# Patient Record
Sex: Female | Born: 1985 | Race: Black or African American | Hispanic: No | Marital: Single | Smoking: Never smoker
Health system: Southern US, Community
[De-identification: ages and names within clinical notes are randomized; demographics above are authoritative.]

## PROBLEM LIST (undated history)

## (undated) DIAGNOSIS — D649 Anemia, unspecified: Secondary | ICD-10-CM

## (undated) DIAGNOSIS — N760 Acute vaginitis: Secondary | ICD-10-CM

## (undated) DIAGNOSIS — B9689 Other specified bacterial agents as the cause of diseases classified elsewhere: Secondary | ICD-10-CM

## (undated) DIAGNOSIS — J45909 Unspecified asthma, uncomplicated: Secondary | ICD-10-CM

## (undated) HISTORY — DX: Acute vaginitis: N76.0

## (undated) HISTORY — DX: Unspecified asthma, uncomplicated: J45.909

## (undated) HISTORY — DX: Other specified bacterial agents as the cause of diseases classified elsewhere: B96.89

## (undated) HISTORY — DX: Anemia, unspecified: D64.9

---
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
## 2007-02-17 ENCOUNTER — Inpatient Hospital Stay (HOSPITAL_COMMUNITY): Admission: AD | Admit: 2007-02-17 | Discharge: 2007-02-17 | Payer: Self-pay | Admitting: Obstetrics and Gynecology

## 2007-02-27 ENCOUNTER — Inpatient Hospital Stay (HOSPITAL_COMMUNITY): Admission: AD | Admit: 2007-02-27 | Discharge: 2007-03-01 | Payer: Self-pay | Admitting: Obstetrics and Gynecology

## 2007-04-08 ENCOUNTER — Other Ambulatory Visit: Admission: RE | Admit: 2007-04-08 | Discharge: 2007-04-08 | Payer: Self-pay | Admitting: Obstetrics and Gynecology

## 2007-08-01 ENCOUNTER — Emergency Department (HOSPITAL_COMMUNITY): Admission: EM | Admit: 2007-08-01 | Discharge: 2007-08-01 | Payer: Self-pay | Admitting: Emergency Medicine

## 2008-10-15 IMAGING — US US OB COMP +14 WK
1 series · 14 of 28 positions shown · non-contrast
Comparison: none

OBSTETRICAL ULTRASOUND:

 This ultrasound exam was performed in the [HOSPITAL] Ultrasound Department.  The OB US report was generated in the AS system, and faxed to the ordering physician.  This report is also available in [REDACTED] PACS.

[Series 1: us ob comp +14 wk · 0.30mm/px · 14 of 76 slices shown]
[im 3/76]
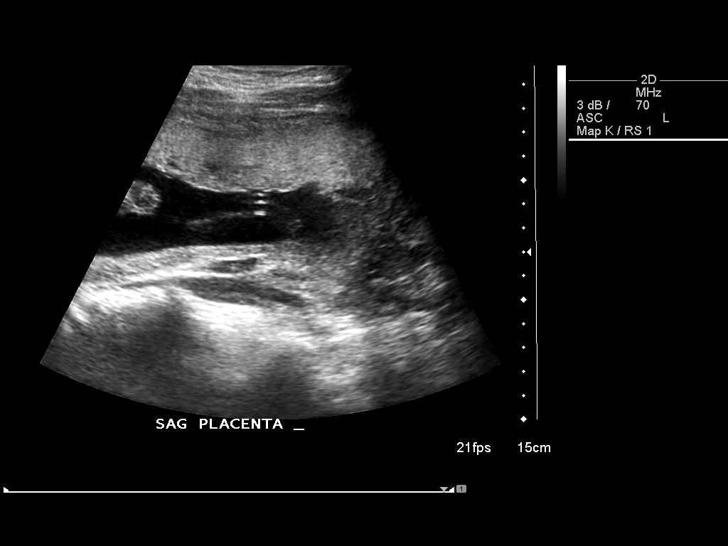
[im 9/76]
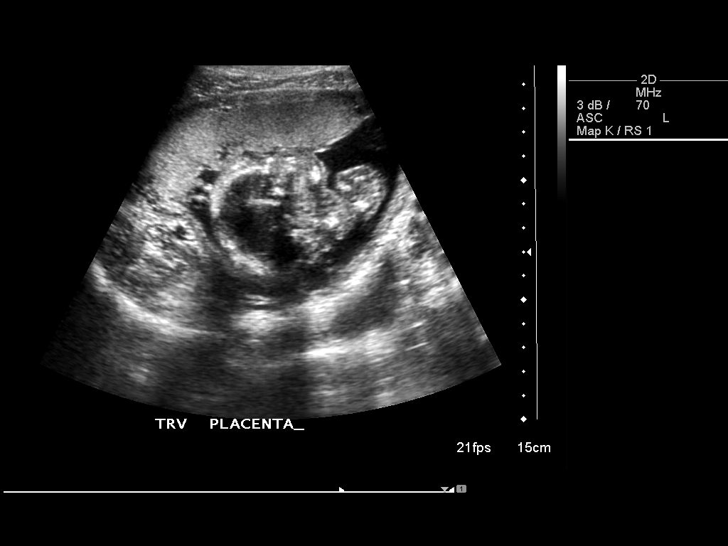
[im 14/76]
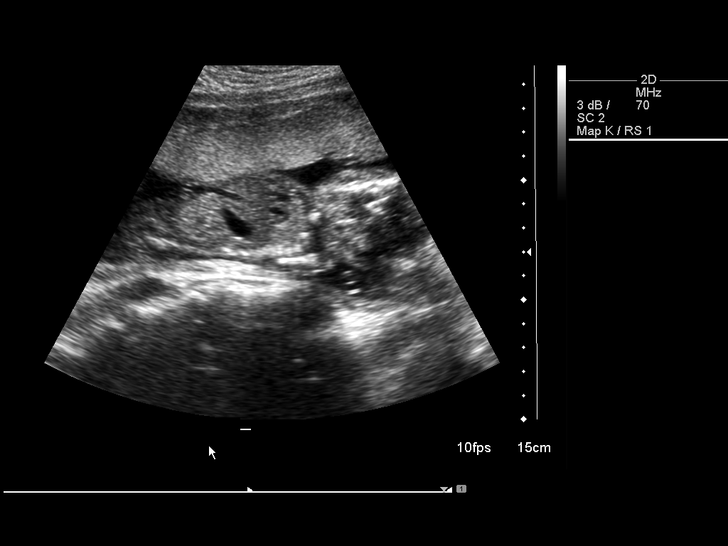
[im 20/76]
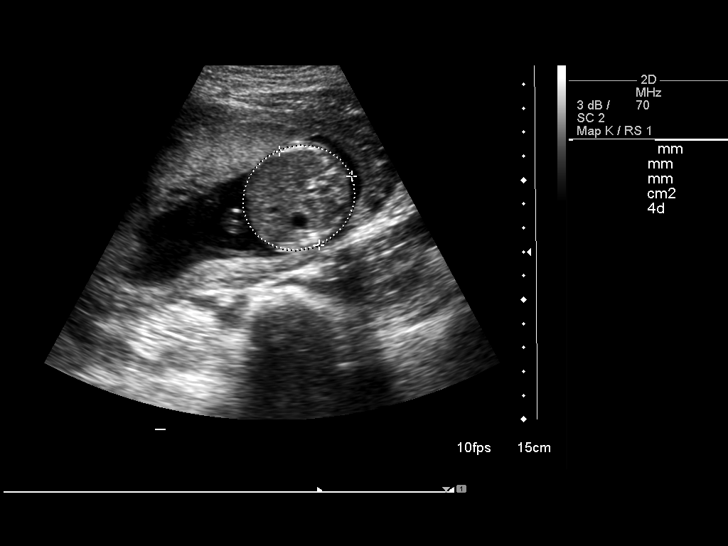
[im 26/76]
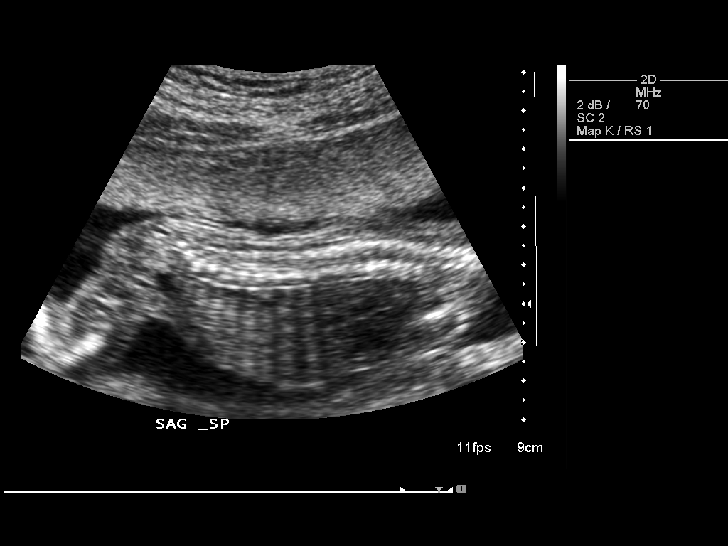
[im 31/76]
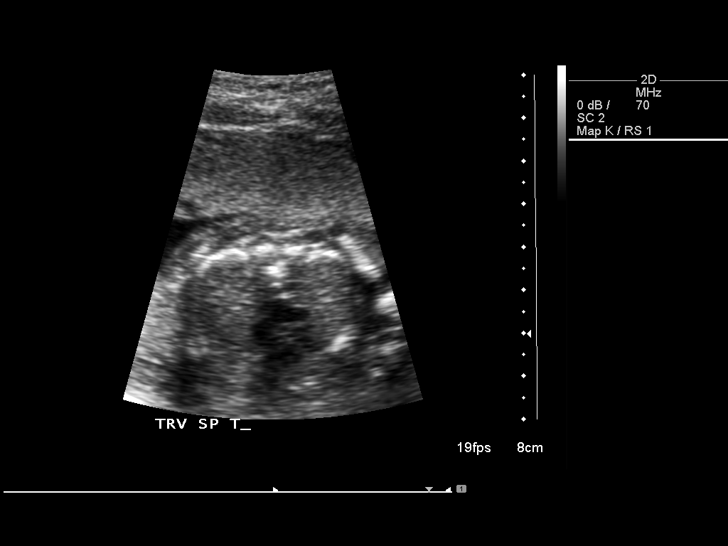
[im 37/76]
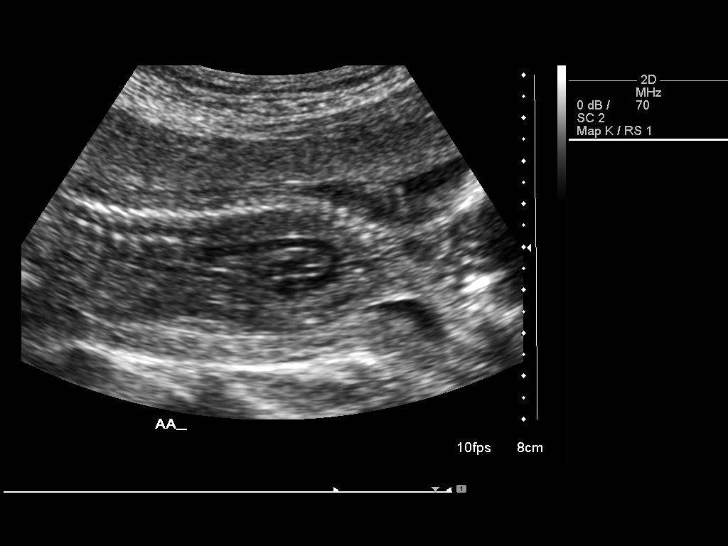
[im 42/76]
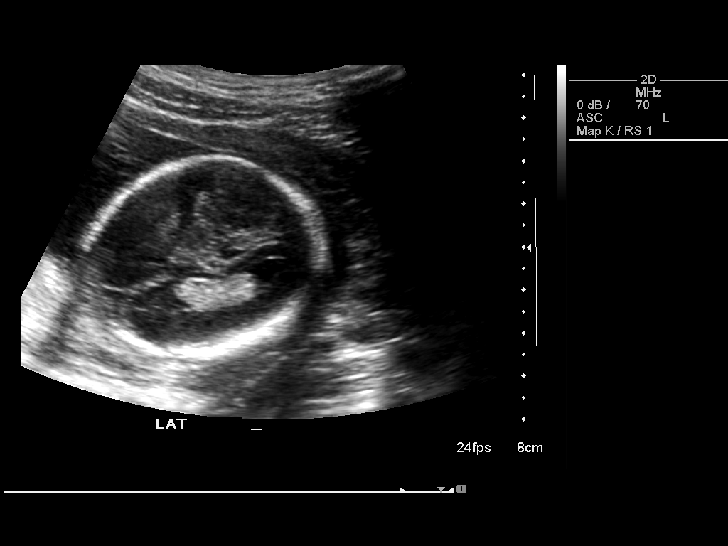
[im 48/76]
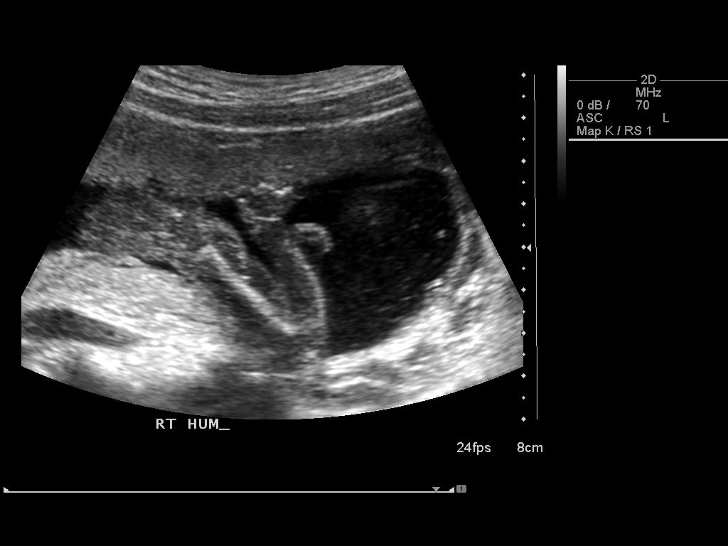
[im 53/76]
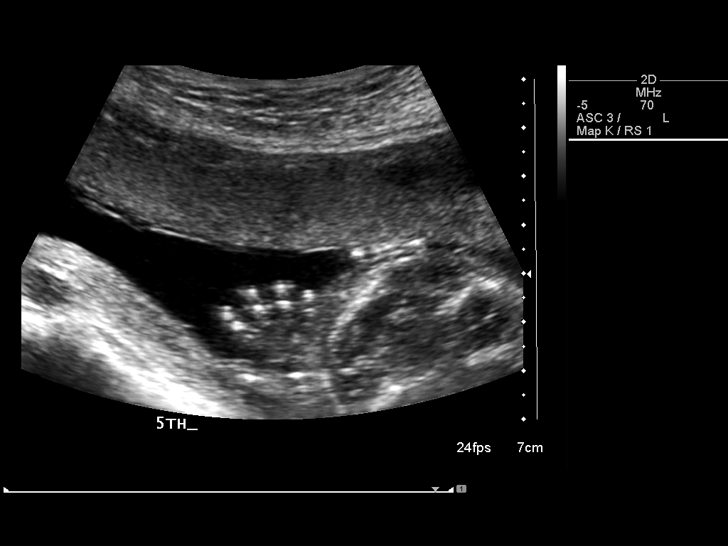
[im 59/76]
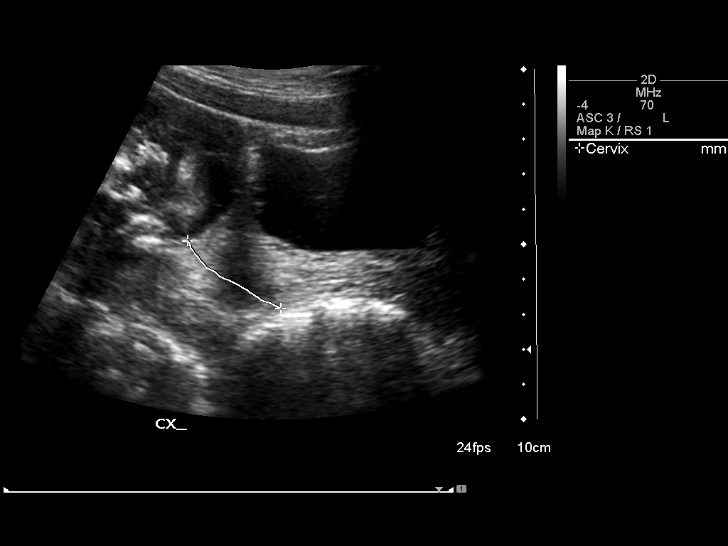
[im 64/76]
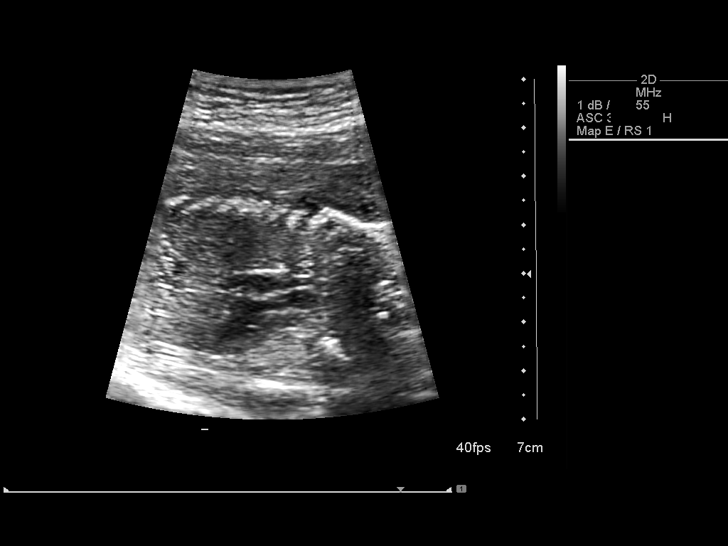
[im 70/76]
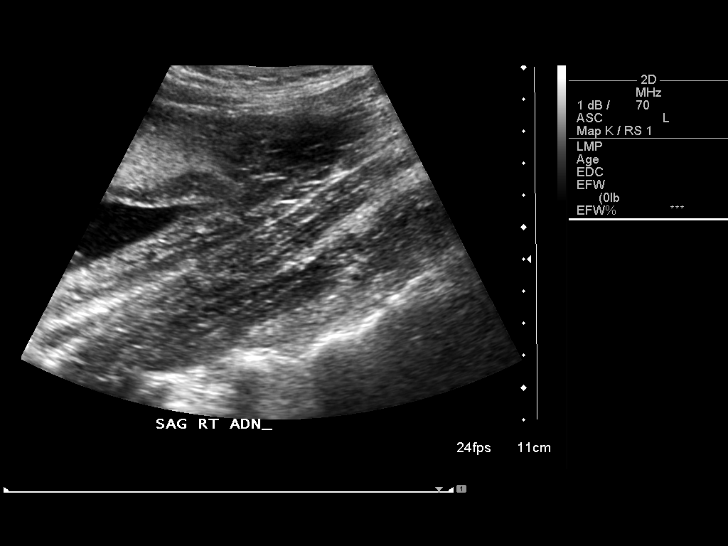
[im 76/76]
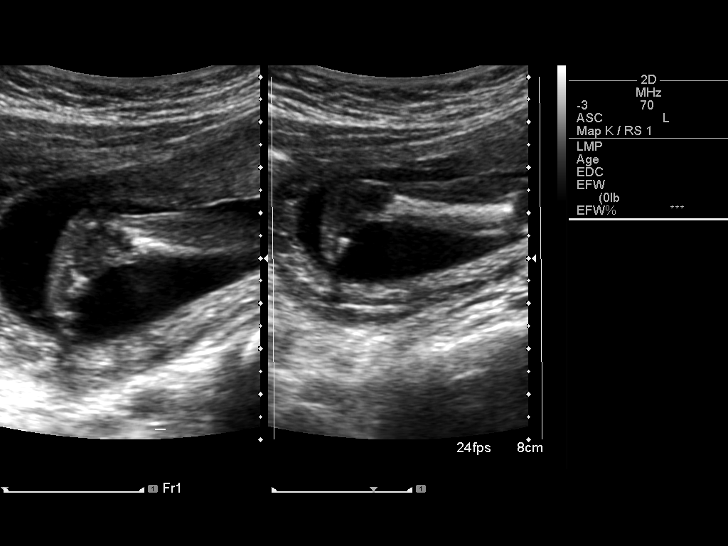

[14 of 28 positions shown; findings below may reference images not displayed]

IMPRESSION: See AS Obstetric US report.

## 2010-06-05 ENCOUNTER — Encounter: Payer: Self-pay | Admitting: Obstetrics and Gynecology

## 2010-09-30 NOTE — Discharge Summary (Signed)
Lindsay Larsen, Lindsay Larsen                ACCOUNT NO.:  [id number]   MEDICAL RECORD NO.:  [id number]          PATIENT TYPE:  INP   LOCATION:  9127                          FACILITY:  [facility]   PHYSICIAN:  Rudy Jew. [name], M.D.DATE OF BIRTH:  1986/03/07   DATE OF ADMISSION:  02/27/2007  DATE OF DISCHARGE:  03/01/2007                               DISCHARGE SUMMARY   DISCHARGE DIAGNOSES:  1. Intrauterine pregnancy at term, delivered.  2. Term birth of living child, vertex.   OPERATIONS AND PROCEDURES:  OB delivery with episiotomy, episiorrhaphy.   CONSULTATIONS:  None.   DISCHARGE MEDICATIONS:  None.   HISTORY AND PHYSICAL:  An 25 year old primigravida, 38 weeks and 4 days  gestation.  Prenatal care was complicated by asthma which required no  medications.  She presented complaining of contractions.  She denied  rupture of membranes or bleeding.  Cervical examination by me revealed 6-  7 cm dilatation, 100% effacement, zero station, vertex presentation.  For the remainder of the history and physical, please see the chart.   HOSPITAL COURSE:  The patient was admitted to [facility] of  [place].  Admission laboratory studies were drawn.  She went on to  labor and delivered on February 27, 2007.  The infant was a 6 pound, 10  ounce female, Apgars 9 at one minute, 9 at five minutes, sent to the  newborn nursery.  The labor was accomplished over a second-degree  midline episiotomy which was repaired without difficulty.  The patient's  postpartum course was benign.  She was discharged on the second  postpartum day, afebrile and in satisfactory condition.   DISPOSITION:  The patient is to return to [facility] at approximately 6 weeks for a postpartum evaluation.      James A. [name], M.D.  Electronically Signed     JAM/MEDQ  D:  03/21/2007  T:  03/21/2007  Job:  [id number]

## 2011-02-06 LAB — RAPID STREP SCREEN (MED CTR MEBANE ONLY): Streptococcus, Group A Screen (Direct): NEGATIVE

## 2011-02-22 LAB — CBC
HCT: 34 — ABNORMAL LOW
MCHC: 34.5
Platelets: 155
Platelets: 166
RBC: 3.24 — ABNORMAL LOW
RDW: 12.5

## 2011-03-02 LAB — KLEIHAUER-BETKE STAIN
Fetal Cells %: 0
Quantitation Fetal Hemoglobin: 0

## 2012-01-26 ENCOUNTER — Ambulatory Visit (INDEPENDENT_AMBULATORY_CARE_PROVIDER_SITE_OTHER): Payer: 59 | Admitting: Obstetrics and Gynecology

## 2012-01-26 ENCOUNTER — Encounter: Payer: Self-pay | Admitting: Obstetrics and Gynecology

## 2012-01-26 VITALS — BP 100/68 | HR 72 | Wt 166.0 lb

## 2012-01-26 DIAGNOSIS — N760 Acute vaginitis: Secondary | ICD-10-CM

## 2012-01-26 DIAGNOSIS — B9689 Other specified bacterial agents as the cause of diseases classified elsewhere: Secondary | ICD-10-CM

## 2012-01-26 DIAGNOSIS — A499 Bacterial infection, unspecified: Secondary | ICD-10-CM

## 2012-01-26 DIAGNOSIS — Z30432 Encounter for removal of intrauterine contraceptive device: Secondary | ICD-10-CM

## 2012-01-26 LAB — POCT WET PREP (WET MOUNT)

## 2012-01-26 MED ORDER — METRONIDAZOLE 500 MG PO TABS: 500.0000 mg | ORAL_TABLET | Freq: Two times a day (BID) | ORAL | Status: DC

## 2012-01-26 NOTE — Patient Instructions (Signed)
Avoid: - excess soap on genital area (consider using plain oatmeal soap) - use of powder or sprays in genital area - douching - wearing underwear to bed (except with menses) - using more than is directed detergent when washing clothes - tight fitting garments around genital area - excess sugar intake   

## 2012-01-26 NOTE — Progress Notes (Signed)
26 YO for Mirena IUD removal (placed by another practice in 2008)  Doesn't want another contraceptive for now, will get another IUD later.  Patient also "thinks" she has another BV infection and wants to be checked.  O: Pelvic:  EGBUS-wnl, vagina-pink discharge, cervix-IUD removed without difficulty, uterus/adnexae-normal  Wet Prep: ph-5.0,  whiff-positive,  moderate clue cells  A:  IUD Removal      Bacterial Vaginosis  P:  Metronidazole 500 mg #14 bid x 7 days,  Etoh precautions       Handouts given:  IUD Instruction Sheet and Methods of Contraception Choices       RTO-as scheduled or prn  Charlette Hennings, PA-C

## 2012-01-29 ENCOUNTER — Other Ambulatory Visit: Payer: Self-pay

## 2012-01-29 ENCOUNTER — Telehealth: Payer: Self-pay | Admitting: Obstetrics and Gynecology

## 2012-01-29 ENCOUNTER — Other Ambulatory Visit: Payer: Self-pay | Admitting: Obstetrics and Gynecology

## 2012-01-29 MED ORDER — METRONIDAZOLE 500 MG PO TABS: 500.0000 mg | ORAL_TABLET | Freq: Two times a day (BID) | ORAL | Status: AC

## 2012-01-29 NOTE — Telephone Encounter (Signed)
Lm on vm rx sen to pharm if pharm states they still haven't received rx call back will call rx in verbally

## 2012-01-29 NOTE — Telephone Encounter (Signed)
Spoke with pt rgd msg pt states rx not at pharmacy advised pt rx sent to pharm 01/26/12 call pharm if still not there call office we can re send rx pt voice understanding

## 2012-01-30 ENCOUNTER — Telehealth: Payer: Self-pay

## 2012-01-30 NOTE — Telephone Encounter (Signed)
Spoke with pt rgd concerns pt states rx sent to [facility] and rx too expensive wants rx called to [facility] advised pt [facility] called to [facility] [phone] pt voice understanding

## 2012-06-28 ENCOUNTER — Encounter: Payer: Self-pay | Admitting: Obstetrics and Gynecology

## 2012-06-28 ENCOUNTER — Ambulatory Visit: Payer: 59 | Admitting: Obstetrics and Gynecology

## 2012-06-28 VITALS — BP 100/70 | Temp 98.7°F | Ht 66.0 in | Wt 170.0 lb

## 2012-06-28 DIAGNOSIS — K625 Hemorrhage of anus and rectum: Secondary | ICD-10-CM

## 2012-06-28 DIAGNOSIS — Z01419 Encounter for gynecological examination (general) (routine) without abnormal findings: Secondary | ICD-10-CM

## 2012-06-28 DIAGNOSIS — Z124 Encounter for screening for malignant neoplasm of cervix: Secondary | ICD-10-CM

## 2012-06-28 DIAGNOSIS — Z113 Encounter for screening for infections with a predominantly sexual mode of transmission: Secondary | ICD-10-CM

## 2012-06-28 LAB — CBC
HCT: 37 % (ref 36.0–46.0)
Hemoglobin: 12.6 g/dL (ref 12.0–15.0)
MCV: 92.5 fL (ref 78.0–100.0)
Platelets: 206 10*3/uL (ref 150–400)
RBC: 4 MIL/uL (ref 3.87–5.11)
WBC: 6.5 10*3/uL (ref 4.0–10.5)

## 2012-06-28 NOTE — Progress Notes (Signed)
Regular Periods: yes Mammogram: no  Monthly Breast Ex.: yes Exercise: yes  Tetanus < 10 years: no Seatbelts: yes  NI. Bladder Functn.: yes Abuse at home: no  Daily BM's: yes Stressful Work: yes  Healthy Diet: yes Sigmoid-Colonoscopy: no  Calcium: no Medical problems this year: none   LAST PAP:2/13  Contraception: abst.  Mammogram:  no  PCP: no  PMH: no change  FMH: no change  Last Bone Scan: no  Pt is single.

## 2012-06-28 NOTE — Progress Notes (Addendum)
Subjective:    Lindsay Larsen is a 27 y.o. female, G1P1, who presents for an annual exam. The patient reports seeing lots of blood in the stool 1-2 times a month with bowel movements, though they are not painful.  Has on occasion had rectal bleeding (bright red blood)  up to 1.5 weeks. Has a BM every other day with no discomfort or gas. Denies family history of colon issues.  Has on occasion had abdominal pains that are crampy in upper abdomen and nauseated.  Does not have a PCP.Admits to some fatigue and occasional breast/chest pains (shooting) but no shortness of breath, craving of ice or dizziness.   Has right shoulder pain with tingling in right hand ulnar aspect, especially when arms are above head. These symptoms have been present for the past year.  Menstrual cycle:   LMP: Patient's last menstrual period was 05/31/2012.             Review of Systems Pertinent items are noted in HPI. Denies pelvic pain, urinary tract symptoms, vaginitis symptoms, irregular bleeding, menopausal symptoms, change in bowel habits or rectal bleeding   Objective:    BP 100/70  Temp(Src) 98.7 F (37.1 C) (Oral)  Ht 5\' 6"  (1.676 m)  Wt 170 lb (77.111 kg)  BMI 27.45 kg/m2  LMP 05/31/2012     Wt Readings from Last 1 Encounters:  06/28/12 170 lb (77.111 kg)   Body mass index is 27.45 kg/(m^2). General Appearance: Alert, no acute distress HEENT: Grossly normal Neck / Thyroid: Supple, no thyromegaly or cervical adenopathy Lungs: Clear to auscultation bilaterally Back: No CVA tenderness Breast Exam: No masses or nodes.No dimpling, nipple retraction or discharge. Cardiovascular: Regular rate and rhythm.  Gastrointestinal: Soft, non-tender, no masses or organomegaly Pelvic Exam: EGBUS-wnl, vagina-normal rugae, cervix- without lesions or tenderness, uterus appears normal size shape and consistency, adnexae-no masses or tenderness Rectal: small fissure but no visible hemorrhoids or bleeding sites Lymphatic Exam:  Non-palpable nodes in neck, clavicular,  axillary, or inguinal regions  Skin: no rashes or abnormalities Extremities: no clubbing cyanosis or edema  Neurologic: grossly normal Psychiatric: Alert and oriented   Assessment:   Routine GYN Exam Rectal Bleeding Right Shoulder Pain and Right Hand Paresthesia   Plan:    PAP sent  STD testing & CBC-pending  Refer to [facility] for evaluation of right shoulder pain  Refer to Dr. Loreta Ave for evaluation of rectal bleeding  RTO 1 year or prn  After exam, patient wanted to be  checked for BV however, gel had been used in vagina.  Will await PAP results  Lindsay Larsen,ELMIRAPA-C

## 2012-07-01 ENCOUNTER — Telehealth: Payer: Self-pay

## 2012-07-01 LAB — PAP IG, CT-NG, RFX HPV ASCU
Chlamydia Probe Amp: NEGATIVE
GC Probe Amp: NEGATIVE

## 2012-07-01 MED ORDER — METRONIDAZOLE 0.75 % VA GEL: VAGINAL | Status: DC

## 2012-07-01 NOTE — Telephone Encounter (Signed)
Tc to pt regarding pap. Informed pt that bv showed on her pap and I need to call in rx for pt. Pt chosed the gel instead of the pill. Will call in rx for pt. Pt voiced understanding.

## 2012-07-01 NOTE — Telephone Encounter (Signed)
Message copied by Winfred Leeds on Mon Jul 01, 2012  2:25 PM ------      Message from: Henreitta Leber      Created: Mon Jul 01, 2012 11:09 AM       Please contact patient to offer Metrogel #1 tube 1 appl pv daily x 5 days or Metronidazole 500 mg bid x 7 days for bacterial vaginosis that showed on PAP smear.  Thank you.  EP                  ----- Message -----         From: [facility]         Sent: 06/28/2012   9:59 PM           To: Henreitta Leber, PA                   ------

## 2012-08-09 ENCOUNTER — Other Ambulatory Visit: Payer: Self-pay | Admitting: Obstetrics and Gynecology

## 2014-01-29 ENCOUNTER — Ambulatory Visit: Payer: Self-pay | Admitting: Physical Therapy

## 2014-03-16 ENCOUNTER — Encounter: Payer: Self-pay | Admitting: Obstetrics and Gynecology

## 2016-01-17 ENCOUNTER — Encounter (HOSPITAL_COMMUNITY): Payer: Self-pay | Admitting: Emergency Medicine

## 2016-01-17 ENCOUNTER — Emergency Department (HOSPITAL_COMMUNITY)
Admission: EM | Admit: 2016-01-17 | Discharge: 2016-01-17 | Disposition: A | Discharge status: Home / Self Care | Payer: BLUE CROSS/BLUE SHIELD | Attending: Emergency Medicine | Admitting: Emergency Medicine

## 2016-01-17 DIAGNOSIS — N760 Acute vaginitis: Secondary | ICD-10-CM | POA: Diagnosis not present

## 2016-01-17 DIAGNOSIS — Z202 Contact with and (suspected) exposure to infections with a predominantly sexual mode of transmission: Secondary | ICD-10-CM | POA: Diagnosis not present

## 2016-01-17 DIAGNOSIS — N898 Other specified noninflammatory disorders of vagina: Secondary | ICD-10-CM | POA: Diagnosis present

## 2016-01-17 DIAGNOSIS — J45909 Unspecified asthma, uncomplicated: Secondary | ICD-10-CM | POA: Insufficient documentation

## 2016-01-17 DIAGNOSIS — B9689 Other specified bacterial agents as the cause of diseases classified elsewhere: Secondary | ICD-10-CM

## 2016-01-17 LAB — URINALYSIS, ROUTINE W REFLEX MICROSCOPIC
Bilirubin Urine: NEGATIVE
GLUCOSE, UA: NEGATIVE mg/dL
Hgb urine dipstick: NEGATIVE
Ketones, ur: NEGATIVE mg/dL
Nitrite: POSITIVE — AB
PH: 5.5 (ref 5.0–8.0)
Protein, ur: NEGATIVE mg/dL
Specific Gravity, Urine: 1.016 (ref 1.005–1.030)

## 2016-01-17 LAB — WET PREP, GENITAL
Sperm: NONE SEEN
Trich, Wet Prep: NONE SEEN
YEAST WET PREP: NONE SEEN

## 2016-01-17 LAB — I-STAT BETA HCG BLOOD, ED (MC, WL, AP ONLY): I-stat hCG, quantitative: <5 m[IU]/mL (ref <5)

## 2016-01-17 LAB — URINE MICROSCOPIC-ADD ON: RBC / HPF: NONE SEEN RBC/hpf (ref 0–5)

## 2016-01-17 LAB — RAPID HIV SCREEN (HIV 1/2 AB+AG)
HIV 1/2 ANTIBODIES: NONREACTIVE
HIV-1 P24 ANTIGEN - HIV24: NONREACTIVE

## 2016-01-17 MED ORDER — AZITHROMYCIN 250 MG PO TABS
1000.0000 mg | ORAL_TABLET | Freq: Once | ORAL | Status: AC
  Administered 2016-01-17: 1000 mg via ORAL
  Filled 2016-01-17: qty 4

## 2016-01-17 MED ORDER — METRONIDAZOLE 500 MG PO TABS: 500.0000 mg | ORAL_TABLET | Freq: Two times a day (BID) | ORAL | 0 refills | Status: AC

## 2016-01-17 MED ORDER — LIDOCAINE HCL 1 % IJ SOLN
INTRAMUSCULAR | Status: AC
  Administered 2016-01-17: 0.9 mL
  Filled 2016-01-17: qty 20

## 2016-01-17 MED ORDER — CEFTRIAXONE SODIUM 250 MG IJ SOLR
250.0000 mg | Freq: Once | INTRAMUSCULAR | Status: AC
  Administered 2016-01-17: 250 mg via INTRAMUSCULAR
  Filled 2016-01-17: qty 250

## 2016-01-17 NOTE — ED Provider Notes (Signed)
[facility]-EMERGENCY DEPT Provider Note   CSN: [id number] Arrival date & time: 01/17/16  0934     History   Chief Complaint Chief Complaint  Patient presents with  . Vaginal Discharge    HPI Lindsay Larsen is a 30 y.o. female.  30 yo F with a chief complaint of STD exposure and vaginal discharge. She was just notified of this yesterday. Denies pelvic pain denies vaginal bleeding denies abdominal pain or fever. Has had some whitish foul-smelling discharge. States that her partner was diagnosed with chlamydia.   The history is provided by the patient.  Vaginal Discharge   This is a new problem. The current episode started yesterday. The problem occurs constantly. The problem has not changed since onset.The discharge occurs while at rest. The discharge was white. She is not pregnant. She has not missed her period. Pertinent negatives include no fever, no nausea, no vomiting and no dysuria. She has tried nothing for the symptoms. The treatment provided no relief.    Past Medical History:  Diagnosis Date  . Anemia    h/o  . Asthma   . Bacterial vaginosis     There are no active problems to display for this patient.   History reviewed. No pertinent surgical history.  OB History    Gravida Para Term Preterm AB Living   1 1       1    SAB TAB Ectopic Multiple Live Births                   Home Medications    Prior to Admission medications   Medication Sig Start Date End Date Taking? Authorizing Provider  metroNIDAZOLE (FLAGYL) 500 MG tablet Take 1 tablet (500 mg total) by mouth 2 (two) times daily. One po bid x 7 days 01/17/16   Melene Plan, DO    Family History Family History  Problem Relation Age of Onset  . Thyroid disease Maternal Grandmother   . Diabetes Other     Social History Social History  Substance Use Topics  . Smoking status: Never Smoker  . Smokeless tobacco: Never Used  . Alcohol use Yes     Comment: occasionally     Allergies   Other; Penicillins;  and Latex   Review of Systems Review of Systems  Constitutional: Negative for chills and fever.  HENT: Negative for congestion and rhinorrhea.   Eyes: Negative for redness and visual disturbance.  Respiratory: Negative for shortness of breath and wheezing.   Cardiovascular: Negative for chest pain and palpitations.  Gastrointestinal: Negative for nausea and vomiting.  Genitourinary: Positive for vaginal discharge. Negative for dysuria and urgency.  Musculoskeletal: Negative for arthralgias and myalgias.  Skin: Negative for pallor and wound.  Neurological: Negative for dizziness and headaches.     Physical Exam Updated Vital Signs BP 124/64 (BP Location: Right Arm)   Pulse 62   Temp 98.2 F (36.8 C) (Oral)   Resp 18   Ht 5\' 6"  (1.676 m)   Wt 196 lb (88.9 kg)   LMP 01/10/2016   SpO2 99%   BMI 31.64 kg/m   Physical Exam  Constitutional: She is oriented to person, place, and time. She appears well-developed and well-nourished. No distress.  HENT:  Head: Normocephalic and atraumatic.  Eyes: EOM are normal. Pupils are equal, round, and reactive to light.  Neck: Normal range of motion. Neck supple.  Cardiovascular: Normal rate and regular rhythm.  Exam reveals no gallop and no friction rub.   No murmur  heard. Pulmonary/Chest: Effort normal. She has no wheezes. She has no rales.  Abdominal: Soft. She exhibits no distension and no mass. There is no tenderness. There is no guarding.  Genitourinary: Cervix exhibits discharge (whitish) and friability. Cervix exhibits no motion tenderness. Right adnexum displays no mass, no tenderness and no fullness. Left adnexum displays no mass, no tenderness and no fullness.  Musculoskeletal: She exhibits no edema or tenderness.  Neurological: She is alert and oriented to person, place, and time.  Skin: Skin is warm and dry. She is not diaphoretic.  Psychiatric: She has a normal mood and affect. Her behavior is normal.  Nursing note and vitals  reviewed.    ED Treatments / Results  Labs (all labs ordered are listed, but only abnormal results are displayed) Labs Reviewed  WET PREP, GENITAL - Abnormal; Notable for the following:       Result Value   Clue Cells Wet Prep HPF POC PRESENT (*)    WBC, Wet Prep HPF POC MANY (*)    All other components within normal limits  URINALYSIS, ROUTINE W REFLEX MICROSCOPIC (NOT AT [facility]) - Abnormal; Notable for the following:    APPearance CLOUDY (*)    Nitrite POSITIVE (*)    Leukocytes, UA TRACE (*)    All other components within normal limits  URINE MICROSCOPIC-ADD ON - Abnormal; Notable for the following:    Squamous Epithelial / LPF 6-30 (*)    Bacteria, UA MANY (*)    All other components within normal limits  RAPID HIV SCREEN (HIV 1/2 AB+AG)  RPR  I-STAT BETA HCG BLOOD, ED ([facility], [facility], [facility] ONLY)  GC/CHLAMYDIA PROBE AMP ([place]) NOT AT [facility]    EKG  EKG Interpretation None       Radiology No results found.  Procedures Procedures (including critical care time)  Medications Ordered in ED Medications  cefTRIAXone (ROCEPHIN) injection 250 mg (250 mg Intramuscular Given 01/17/16 1100)  azithromycin (ZITHROMAX) tablet 1,000 mg (1,000 mg Oral Given 01/17/16 1100)  lidocaine (XYLOCAINE) 1 % (with pres) injection (0.9 mLs  Given 01/17/16 1101)     Initial Impression / Assessment and Plan / ED Course  I have reviewed the triage vital signs and the nursing notes.  Pertinent labs & imaging results that were available during my care of the patient were reviewed by me and considered in my medical decision making (see chart for details).  Clinical Course    30 yo F With a chief complaint of vaginal discharge and chlamydial exposure. Will treat. GU exam consistent with cervicitis though no cervical motion tenderness.  3:16 PM:  I have discussed the diagnosis/risks/treatment options with the patient and family and believe the pt to be eligible for discharge home to follow-up with PCP.  We also discussed returning to the ED immediately if new or worsening sx occur. We discussed the sx which are most concerning (e.g., sudden worsening pain, fever, inability to tolerate by mouth) that necessitate immediate return. Medications administered to the patient during their visit and any new prescriptions provided to the patient are listed below.  Medications given during this visit Medications  cefTRIAXone (ROCEPHIN) injection 250 mg (250 mg Intramuscular Given 01/17/16 1100)  azithromycin (ZITHROMAX) tablet 1,000 mg (1,000 mg Oral Given 01/17/16 1100)  lidocaine (XYLOCAINE) 1 % (with pres) injection (0.9 mLs  Given 01/17/16 1101)     The patient appears reasonably screen and/or stabilized for discharge and I doubt any other medical condition or other [facility] requiring further screening,  evaluation, or treatment in the ED at this time prior to discharge.    Final Clinical Impressions(s) / ED Diagnoses   Final diagnoses:  BV (bacterial vaginosis)  STD exposure    New Prescriptions Discharge Medication List as of 01/17/2016 11:33 AM    START taking these medications   Details  metroNIDAZOLE (FLAGYL) 500 MG tablet Take 1 tablet (500 mg total) by mouth 2 (two) times daily. One po bid x 7 days, Starting Mon 01/17/2016, [employer]         Levorn Oleski, DO 01/17/16 [phone]

## 2016-01-17 NOTE — ED Triage Notes (Signed)
Pt complaint of vaginal white discharge/odor and mild pelvic pain. Recently told partner has STI.

## 2016-01-18 LAB — GC/CHLAMYDIA PROBE AMP (~~LOC~~) NOT AT ARMC
CHLAMYDIA, DNA PROBE: NEGATIVE
Neisseria Gonorrhea: NEGATIVE

## 2016-01-18 LAB — RPR: RPR Ser Ql: NONREACTIVE

## 2018-10-22 ENCOUNTER — Other Ambulatory Visit: Payer: Self-pay | Admitting: Obstetrics & Gynecology

## 2018-10-27 ENCOUNTER — Encounter (HOSPITAL_COMMUNITY): Payer: Self-pay | Admitting: Emergency Medicine

## 2018-10-27 ENCOUNTER — Emergency Department (HOSPITAL_COMMUNITY)
Admission: EM | Admit: 2018-10-27 | Discharge: 2018-10-27 | Disposition: A | Discharge status: Home / Self Care | Payer: BC Managed Care – PPO | Attending: Emergency Medicine | Admitting: Emergency Medicine

## 2018-10-27 ENCOUNTER — Other Ambulatory Visit: Payer: Self-pay

## 2018-10-27 DIAGNOSIS — R1110 Vomiting, unspecified: Secondary | ICD-10-CM | POA: Insufficient documentation

## 2018-10-27 DIAGNOSIS — F10920 Alcohol use, unspecified with intoxication, uncomplicated: Secondary | ICD-10-CM

## 2018-10-27 DIAGNOSIS — F10120 Alcohol abuse with intoxication, uncomplicated: Secondary | ICD-10-CM | POA: Diagnosis present

## 2018-10-27 DIAGNOSIS — Z91040 Latex allergy status: Secondary | ICD-10-CM | POA: Diagnosis not present

## 2018-10-27 DIAGNOSIS — J45909 Unspecified asthma, uncomplicated: Secondary | ICD-10-CM | POA: Insufficient documentation

## 2018-10-27 MED ORDER — ONDANSETRON 4 MG PO TBDP: 4.0000 mg | ORAL_TABLET | Freq: Three times a day (TID) | ORAL | 0 refills | Status: AC | PRN

## 2018-10-27 MED ORDER — ONDANSETRON 4 MG PO TBDP
4.0000 mg | ORAL_TABLET | Freq: Once | ORAL | Status: AC
  Administered 2018-10-27: 4 mg via ORAL
  Filled 2018-10-27: qty 1

## 2018-10-27 NOTE — ED Notes (Signed)
Bed: [facility] Expected date:  Expected time:  Means of arrival:  Comments: 

## 2018-10-27 NOTE — ED Triage Notes (Signed)
Pt presents by [facility] for alcohol intoxication. Pt reported to EMS that she had been drinking liquor. Pt able to answer questions but drowsy.

## 2018-10-27 NOTE — Discharge Instructions (Addendum)
Don't drink excessively.   Return to ER for new or worsening symptoms, any additional concerns.

## 2018-10-27 NOTE — ED Notes (Signed)
Pt was able to ambulate without difficulty no acute distress. Pt is discharged in care of mother.

## 2018-10-27 NOTE — ED Provider Notes (Signed)
[facility]EMER[facility] DEPT Provider Note   CSN: [id number] Arrival date & time: 10/27/18  0205    History   Chief Complaint Chief Complaint  Patient presents with  . Alcohol Intoxication    HPI Lindsay Larsen is a 33 y.o. female.     The history is provided by the patient and medical records. No language interpreter was used.  Alcohol Intoxication   Lindsay Larsen is a 33 y.o. female who presents to the emergency department for alcohol intoxication.  Patient states that [birth year] her birthday last night and that she drank too much.  She denies fall or head injury.  No abdominal pain.  Multiple episodes of emesis at home, but denies feeling nauseous currently.  Denies any illicit drug use or medications taken prior to arrival.  History somewhat limited given alcohol intoxication.   Past Medical History:  Diagnosis Date  . Anemia    h/o  . Asthma   . Bacterial vaginosis     There are no active problems to display for this patient.   History reviewed. No pertinent surgical history.   OB History    Gravida  1   Para  1   Term      Preterm      AB      Living  1     SAB      TAB      Ectopic      Multiple      Live Births               Home Medications    Prior to Admission medications   Medication Sig Start Date End Date Taking? Authorizing Provider  metroNIDAZOLE (FLAGYL) 500 MG tablet Take 1 tablet (500 mg total) by mouth 2 (two) times daily. One po bid x 7 days 01/17/16   Melene PlanFloyd, Dan, DO  ondansetron (ZOFRAN ODT) 4 MG disintegrating tablet Take 1 tablet (4 mg total) by mouth every 8 (eight) hours as needed for nausea or vomiting. 10/27/18   Ward, Chase PicketJaime Pilcher, PA-C    Family History Family History  Problem Relation Age of Onset  . Thyroid disease Maternal Grandmother   . Diabetes Other     Social History Social History   Tobacco Use  . Smoking status: Never Smoker  . Smokeless tobacco: Never Used  Substance Use  Topics  . Alcohol use: Yes    Comment: occasionally  . Drug use: No     Allergies   Other, Penicillins, and Latex   Review of Systems Review of Systems  Gastrointestinal: Positive for nausea and vomiting.  All other systems reviewed and are negative.    Physical Exam Updated Vital Signs BP 118/75 (BP Location: Right Arm)   Pulse 63   Temp 97.9 F (36.6 C) (Oral)   Resp 18   Ht 5\' 6"  (1.676 m)   Wt 105.2 kg   SpO2 96%   BMI 37.45 kg/m   Physical Exam Vitals signs and nursing note reviewed.  Constitutional:      General: She is not in acute distress.    Appearance: She is well-developed.  HENT:     Head: Normocephalic.     Comments: Atraumatic. Neck:     Musculoskeletal: Neck supple.     Comments: No midline or paraspinal tenderness. Cardiovascular:     Rate and Rhythm: Normal rate and regular rhythm.     Heart sounds: Normal heart sounds. No murmur.  Pulmonary:  Effort: Pulmonary effort is normal. No respiratory distress.     Breath sounds: Normal breath sounds.  Abdominal:     General: There is no distension.     Palpations: Abdomen is soft.     Tenderness: There is no abdominal tenderness.  Skin:    General: Skin is warm and dry.  Neurological:     Mental Status: She is alert.     Comments: Drowsy, but arousable. Oriented x4. CN 2-12 grossly intact. Moves all four extremities independently.       ED Treatments / Results  Labs (all labs ordered are listed, but only abnormal results are displayed) Labs Reviewed - No data to display  EKG None  Radiology No results found.  Procedures Procedures (including critical care time)  Medications Ordered in ED Medications  ondansetron (ZOFRAN-ODT) disintegrating tablet 4 mg (has no administration in time range)     Initial Impression / Assessment and Plan / ED Course  I have reviewed the triage vital signs and the nursing notes.  Pertinent labs & imaging results that were available during my  care of the patient were reviewed by me and considered in my medical decision making (see chart for details).       Lindsay Larsen is a 33 y.o. female who presents to ED for evaluation after drinking too much alcohol celebrating her 33st birthday last night. Denies fall or trauma. Appears clinically intoxicated. No signs of trauma. Will observe.   Patient monitored in ED for 3+ hours. Continued improvement throughout ED stay. On final evaluation, patient ambulating in ED with steady gait. Again reports that she was drinking heavily with no injury or other complaints. Her parents are outside and nursing staff has spoken with them. They will drive her home and stay with her tonight. Discussed importance of not drinking ETOH this excessively. A few zofran given. Return precautions discussed and all questions answered.   Patient discussed with Dr. Stark Jock who agrees with treatment plan.    Final Clinical Impressions(s) / ED Diagnoses   Final diagnoses:  Alcoholic intoxication without complication Lindsay Larsen)    ED Discharge Orders         Ordered    ondansetron (ZOFRAN ODT) 4 MG disintegrating tablet  Every 8 hours PRN     10/27/18 0510           Ward, Ozella Almond, PA-C 10/27/18 2703    Veryl Speak, MD 10/27/18 [phone]

## 2020-11-12 ENCOUNTER — Other Ambulatory Visit: Payer: Self-pay | Admitting: Obstetrics & Gynecology
